# Patient Record
Sex: Male | Born: 1985 | Race: Black or African American | Hispanic: No | Marital: Single | Smoking: Never smoker
Health system: Southern US, Community
[De-identification: ages and names within clinical notes are randomized; demographics above are authoritative.]

## PROBLEM LIST (undated history)

## (undated) HISTORY — PX: FACIAL RECONSTRUCTION SURGERY: SHX631

## (undated) NOTE — ED Notes (Signed)
Formatting of this note might be different from the original.  Pt reports needing sutures removed from left eyelid, placed on 2/11. No other complaints.   Electronically signed by Gaylan Gerold, RN at 09/08/2015  2:38 PM EST
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
## (undated) NOTE — ED Notes (Signed)
Formatting of this note might be different from the original.  Declined W/C at D/C and was escorted to lobby by RN.  Electronically signed by Maryan Puls, RN at 09/08/2015  3:50 PM EST

---
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
## 2013-08-16 ENCOUNTER — Ambulatory Visit
Admission: RE | Admit: 2013-08-16 | Discharge: 2013-08-16 | Disposition: A | Discharge status: Home / Self Care | Payer: Self-pay | Source: Ambulatory Visit | Attending: Internal Medicine | Admitting: Internal Medicine

## 2014-10-08 ENCOUNTER — Emergency Department (HOSPITAL_COMMUNITY)
Admission: EM | Admit: 2014-10-08 | Discharge: 2014-10-08 | Disposition: A | Discharge status: Home / Self Care | Payer: BLUE CROSS/BLUE SHIELD | Attending: Emergency Medicine | Admitting: Emergency Medicine

## 2014-10-08 ENCOUNTER — Encounter (HOSPITAL_COMMUNITY): Payer: Self-pay | Admitting: *Deleted

## 2014-10-08 ENCOUNTER — Emergency Department (HOSPITAL_COMMUNITY): Payer: BLUE CROSS/BLUE SHIELD

## 2014-10-08 DIAGNOSIS — M25571 Pain in right ankle and joints of right foot: Secondary | ICD-10-CM

## 2014-10-08 DIAGNOSIS — Y9367 Activity, basketball: Secondary | ICD-10-CM | POA: Diagnosis not present

## 2014-10-08 DIAGNOSIS — Y9289 Other specified places as the place of occurrence of the external cause: Secondary | ICD-10-CM | POA: Diagnosis not present

## 2014-10-08 DIAGNOSIS — X58XXXA Exposure to other specified factors, initial encounter: Secondary | ICD-10-CM | POA: Insufficient documentation

## 2014-10-08 DIAGNOSIS — Y998 Other external cause status: Secondary | ICD-10-CM | POA: Diagnosis not present

## 2014-10-08 DIAGNOSIS — S99911A Unspecified injury of right ankle, initial encounter: Secondary | ICD-10-CM | POA: Diagnosis present

## 2014-10-08 DIAGNOSIS — Y9389 Activity, other specified: Secondary | ICD-10-CM | POA: Diagnosis not present

## 2014-10-08 MED ORDER — IBUPROFEN 800 MG PO TABS: 800.0000 mg | ORAL_TABLET | Freq: Three times a day (TID) | ORAL | Status: AC | PRN

## 2014-10-08 MED ORDER — IBUPROFEN 400 MG PO TABS
800.0000 mg | ORAL_TABLET | Freq: Once | ORAL | Status: AC
  Administered 2014-10-08: 800 mg via ORAL
  Filled 2014-10-08: qty 2

## 2014-10-08 NOTE — Discharge Instructions (Signed)
Read the information below.  Use the prescribed medication as directed.  Please discuss all new medications with your pharmacist.  You may return to the Emergency Department at any time for worsening condition or any new symptoms that concern you.   If you develop uncontrolled pain, weakness or numbness of the extremity, severe discoloration of the skin, or you are unable to walk or move your toes, return to the ER for a recheck.      Ankle Pain Ankle pain is a common symptom. The bones, cartilage, tendons, and muscles of the ankle joint perform a lot of work each day. The ankle joint holds your body weight and allows you to move around. Ankle pain can occur on either side or back of 1 or both ankles. Ankle pain may be sharp and burning or dull and aching. There may be tenderness, stiffness, redness, or warmth around the ankle. The pain occurs more often when a person walks or puts pressure on the ankle. CAUSES  There are many reasons ankle pain can develop. It is important to work with your caregiver to identify the cause since many conditions can impact the bones, cartilage, muscles, and tendons. Causes for ankle pain include:  Injury, including a break (fracture), sprain, or strain often due to a fall, sports, or a high-impact activity.  Swelling (inflammation) of a tendon (tendonitis).  Achilles tendon rupture.  Ankle instability after repeated sprains and strains.  Poor foot alignment.  Pressure on a nerve (tarsal tunnel syndrome).  Arthritis in the ankle or the lining of the ankle.  Crystal formation in the ankle (gout or pseudogout). DIAGNOSIS  A diagnosis is based on your medical history, your symptoms, results of your physical exam, and results of diagnostic tests. Diagnostic tests may include X-ray exams or a computerized magnetic scan (magnetic resonance imaging, MRI). TREATMENT  Treatment will depend on the cause of your ankle pain and may include:  Keeping pressure off the  ankle and limiting activities.  Using crutches or other walking support (a cane or brace).  Using rest, ice, compression, and elevation.  Participating in physical therapy or home exercises.  Wearing shoe inserts or special shoes.  Losing weight.  Taking medications to reduce pain or swelling or receiving an injection.  Undergoing surgery. HOME CARE INSTRUCTIONS   Only take over-the-counter or prescription medicines for pain, discomfort, or fever as directed by your caregiver.  Put ice on the injured area.  Put ice in a plastic bag.  Place a towel between your skin and the bag.  Leave the ice on for 15-20 minutes at a time, 03-04 times a day.  Keep your leg raised (elevated) when possible to lessen swelling.  Avoid activities that cause ankle pain.  Follow specific exercises as directed by your caregiver.  Record how often you have ankle pain, the location of the pain, and what it feels like. This information may be helpful to you and your caregiver.  Ask your caregiver about returning to work or sports and whether you should drive.  Follow up with your caregiver for further examination, therapy, or testing as directed. SEEK MEDICAL CARE IF:   Pain or swelling continues or worsens beyond 1 week.  You have an oral temperature above 102 F (38.9 C).  You are feeling unwell or have chills.  You are having an increasingly difficult time with walking.  You have loss of sensation or other new symptoms.  You have questions or concerns. MAKE SURE YOU:  Understand these instructions.  Will watch your condition.  Will get help right away if you are not doing well or get worse. Document Released: 12/25/2009 Document Revised: 09/29/2011 Document Reviewed: 12/25/2009 [facility] Patient Information 2015 [place], [place]. This information is not intended to replace advice given to you by your health care provider. Make sure you discuss any questions you have with your  health care provider.

## 2014-10-08 NOTE — ED Notes (Signed)
Pt was playing basketball yesterday and rolled his ankle twice.  Rt foot swollen.

## 2014-10-08 NOTE — ED Provider Notes (Signed)
CSN: [id number]     Arrival date & time 10/08/14  1515 History  This chart was scribed for non-physician practitioner, Trixie DredgeEmily Tessia Kassin, PA-C, working with Samuel JesterKathleen McManus, DO, by Modena JanskyAlbert Thayil, ED Scribe. This patient was seen in room TR05C/TR05C and the patient's care was started at 3:56 PM.   Chief Complaint  Patient presents with  . Foot Pain   The history is provided by the patient. No language interpreter was used.   HPI Comments: Shawn Moss is a 29 y.o. male who presents to the Emergency Department complaining of constant moderate right ankle pain that started yesterday. He reports that he was playing basketball yesterday when he sprained his right ankle twice, and another person fell on him the second time. He denies any LOC or head injury. He states that he has constant moderate right ankle pain with swelling. He describes the pain as a throbbing sensation, and rates it as a 6/10 in severity. He denies any weakness or numbness.  Denies pain in the foot, shin/calf, or knee.   History reviewed. No pertinent past medical history. History reviewed. No pertinent past surgical history. No family history on file. History  Substance Use Topics  . Smoking status: Never Smoker   . Smokeless tobacco: Not on file  . Alcohol Use: No    Review of Systems  Constitutional: Negative for fever and chills.  Musculoskeletal: Positive for myalgias, joint swelling and arthralgias.  Skin: Negative for color change.  Allergic/Immunologic: Negative for immunocompromised state.  Neurological: Negative for syncope and weakness.  Hematological: Does not bruise/bleed easily.  Psychiatric/Behavioral: Negative for self-injury (accidental).    Allergies  Review of patient's allergies indicates no known allergies.  Home Medications   Prior to Admission medications   Not on File   BP 137/81 mmHg  Pulse 66  Temp(Src) 98.2 F (36.8 C) (Oral)  Resp 16  Ht 6\' 2"  (1.88 m)  Wt 160 lb (72.576 kg)  BMI  20.53 kg/m2  SpO2 96% Physical Exam  Constitutional: He appears well-developed and well-nourished. No distress.  HENT:  Head: Normocephalic and atraumatic.  Neck: Neck supple.  Pulmonary/Chest: Effort normal.  Musculoskeletal:  Diffuse edema of the right ankle, no TTP.  DP pulses intact. Reduced ROM of right ankle. Full active ROM of the toes. Sensations intact. Cap refill is less than 2 seconds. No shin, calf, or knee TTP.   Neurological: He is alert.  Skin: He is not diaphoretic.  Nursing note and vitals reviewed.   ED Course  Procedures (including critical care time) DIAGNOSTIC STUDIES: Oxygen Saturation is 96% on RA, normal by my interpretation.    COORDINATION OF CARE: 4:00 PM- Pt advised of plan for treatment which includes radiology and pt agrees.  Labs Review Labs Reviewed - No data to display  Imaging Review Dg Ankle Complete Right  10/08/2014   CLINICAL DATA:  Basketball injury with right ankle pain and swelling today. Initial encounter.  EXAM: RIGHT ANKLE - COMPLETE 3+ VIEW  COMPARISON:  None.  FINDINGS: There is no evidence of acute fracture, subluxation or dislocation.  The ankle mortise is intact.  The talar dome is unremarkable.  Soft tissue swelling is present.  IMPRESSION: Soft tissue swelling without bony abnormality.   Electronically Signed   By: Harmon PierJeffrey  Hu M.D.   On: 10/08/2014 16:35   Dg Foot Complete Right  10/08/2014   CLINICAL DATA:  Basketball injury with right foot pain and swelling. Initial encounter.  EXAM: RIGHT FOOT COMPLETE - 3+ VIEW  COMPARISON:  None.  FINDINGS: There is no evidence fracture, subluxation or dislocation.  The Lisfranc joints are unremarkable.  No focal bony lesions are present.  Soft tissue swelling is present.  IMPRESSION: Soft tissue swelling without bony abnormality.   Electronically Signed   By: Harmon Pier M.D.   On: 10/08/2014 16:36     EKG Interpretation None      MDM   Final diagnoses:  Right ankle pain     Afebrile, nontoxic patient with injury to his right ankle while playing basketball.   Xray negative.  Placed in ASO, given crutches.   D/C home with ibuprofen, PCP follow up PRN.  Discussed result, findings, treatment, and follow up  with patient.  Pt given return precautions.  Pt verbalizes understanding and agrees with plan.       I personally performed the services described in this documentation, which was scribed in my presence. The recorded information has been reviewed and is accurate.    Trixie Dredge, PA-C 10/08/14 1651  Samuel Jester, DO 10/09/14 1330

## 2014-10-08 NOTE — ED Notes (Signed)
Declined W/C at D/C and was escorted to lobby by RN. 

## 2015-09-01 ENCOUNTER — Encounter (HOSPITAL_COMMUNITY): Payer: Self-pay | Admitting: *Deleted

## 2015-09-01 ENCOUNTER — Emergency Department (HOSPITAL_COMMUNITY)
Admission: EM | Admit: 2015-09-01 | Discharge: 2015-09-01 | Disposition: A | Discharge status: Home / Self Care | Payer: BLUE CROSS/BLUE SHIELD | Attending: Emergency Medicine | Admitting: Emergency Medicine

## 2015-09-01 DIAGNOSIS — Y92310 Basketball court as the place of occurrence of the external cause: Secondary | ICD-10-CM | POA: Diagnosis not present

## 2015-09-01 DIAGNOSIS — Y998 Other external cause status: Secondary | ICD-10-CM | POA: Insufficient documentation

## 2015-09-01 DIAGNOSIS — Y9367 Activity, basketball: Secondary | ICD-10-CM | POA: Diagnosis not present

## 2015-09-01 DIAGNOSIS — W51XXXA Accidental striking against or bumped into by another person, initial encounter: Secondary | ICD-10-CM | POA: Diagnosis not present

## 2015-09-01 DIAGNOSIS — S0181XA Laceration without foreign body of other part of head, initial encounter: Secondary | ICD-10-CM

## 2015-09-01 DIAGNOSIS — S01112A Laceration without foreign body of left eyelid and periocular area, initial encounter: Secondary | ICD-10-CM | POA: Insufficient documentation

## 2015-09-01 MED ORDER — LIDOCAINE HCL (PF) 1 % IJ SOLN
5.0000 mL | Freq: Once | INTRAMUSCULAR | Status: AC
  Administered 2015-09-01: 5 mL
  Filled 2015-09-01: qty 5
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
## 2015-09-01 NOTE — ED Notes (Signed)
Pt reports while he was playing basket ball he ran into the head of another player. Pt has a lac to Lt upper eye lid. Pt denies any Loc at time of accident. A/O and speaking in full sentences.

## 2015-09-01 NOTE — ED Notes (Signed)
Declined W/C at D/C and was escorted to lobby by RN. 

## 2015-09-01 NOTE — Discharge Instructions (Signed)
Mr. Shawn Moss,  Nice meeting you! Please follow-up with your primary care provider. You need to have your stiches removed in 3-5 days. You can have these removed anywhere. Return to the emergency department if you develop fevers, chills, yellow/green drainage from the wound, or you develop red streaks around the wound. Feel better soon!  S. Lane Hacker, PA-C   Laceration Care, Adult A laceration is a cut that goes through all layers of the skin. The cut also goes into the tissue that is right under the skin. Some cuts heal on their own. Others need to be closed with stitches (sutures), staples, skin adhesive strips, or wound glue. Taking care of your cut lowers your risk of infection and helps your cut to heal better. HOW TO TAKE CARE OF YOUR CUT For stitches or staples:  Keep the wound clean and dry.  If you were given a bandage (dressing), you should change it at least one time per day or as told by your doctor. You should also change it if it gets wet or dirty.  Keep the wound completely dry for the first 24 hours or as told by your doctor. After that time, you may take a shower or a bath. However, make sure that the wound is not soaked in water until after the stitches or staples have been removed.  Clean the wound one time each day or as told by your doctor:  Wash the wound with soap and water.  Rinse the wound with water until all of the soap comes off.  Pat the wound dry with a clean towel. Do not rub the wound.  After you clean the wound, put a thin layer of antibiotic ointment on it as told by your doctor. This ointment:  Helps to prevent infection.  Keeps the bandage from sticking to the wound.  Have your stitches or staples removed as told by your doctor. If your doctor used skin adhesive strips:   Keep the wound clean and dry.  If you were given a bandage, you should change it at least one time per day or as told by your doctor. You should also change it if it  gets dirty or wet.  Do not get the skin adhesive strips wet. You can take a shower or a bath, but be careful to keep the wound dry.  If the wound gets wet, pat it dry with a clean towel. Do not rub the wound.  Skin adhesive strips fall off on their own. You can trim the strips as the wound heals. Do not remove any strips that are still stuck to the wound. They will fall off after a while. If your doctor used wound glue:  Try to keep your wound dry, but you may briefly wet it in the shower or bath. Do not soak the wound in water, such as by swimming.  After you take a shower or a bath, gently pat the wound dry with a clean towel. Do not rub the wound.  Do not do any activities that will make you really sweaty until the skin glue has fallen off on its own.  Do not apply liquid, cream, or ointment medicine to your wound while the skin glue is still on.  If you were given a bandage, you should change it at least one time per day or as told by your doctor. You should also change it if it gets dirty or wet.  If a bandage is placed over the wound,  do not let the tape for the bandage touch the skin glue.  Do not pick at the glue. The skin glue usually stays on for 5-10 days. Then, it falls off of the skin. General Instructions  To help prevent scarring, make sure to cover your wound with sunscreen whenever you are outside after stitches are removed, after adhesive strips are removed, or when wound glue stays in place and the wound is healed. Make sure to wear a sunscreen of at least 30 SPF.  Take over-the-counter and prescription medicines only as told by your doctor.  If you were given antibiotic medicine or ointment, take or apply it as told by your doctor. Do not stop using the antibiotic even if your wound is getting better.  Do not scratch or pick at the wound.  Keep all follow-up visits as told by your doctor. This is important.  Check your wound every day for signs of infection.  Watch for:  Redness, swelling, or pain.  Fluid, blood, or pus.  Raise (elevate) the injured area above the level of your heart while you are sitting or lying down, if possible. GET HELP IF:  You got a tetanus shot and you have any of these problems at the injection site:  Swelling.  Very bad pain.  Redness.  Bleeding.  You have a fever.  A wound that was closed breaks open.  You notice a bad smell coming from your wound or your bandage.  You notice something coming out of the wound, such as wood or glass.  Medicine does not help your pain.  You have more redness, swelling, or pain at the site of your wound.  You have fluid, blood, or pus coming from your wound.  You notice a change in the color of your skin near your wound.  You need to change the bandage often because fluid, blood, or pus is coming from the wound.  You start to have a new rash.  You start to have numbness around the wound. GET HELP RIGHT AWAY IF:  You have very bad swelling around the wound.  Your pain suddenly gets worse and is very bad.  You notice painful lumps near the wound or on skin that is anywhere on your body.  You have a red streak going away from your wound.  The wound is on your hand or foot and you cannot move a finger or toe like you usually can.  The wound is on your hand or foot and you notice that your fingers or toes look pale or bluish.   This information is not intended to replace advice given to you by your health care provider. Make sure you discuss any questions you have with your health care provider.   Document Released: 12/24/2007 Document Revised: 11/21/2014 Document Reviewed: 07/03/2014 Elsevier Interactive Patient Education 2016 [employer]  Facial Laceration A facial laceration is a cut on the face. These injuries can be painful and cause bleeding. Some cuts may need to be closed with stitches (sutures), skin adhesive strips, or wound glue. Cuts usually  heal quickly but can leave a scar. It can take 1-2 years for the scar to go away completely. HOME CARE   Only take medicines as told by your doctor.  Follow your doctor's instructions for wound care. For Stitches:  Keep the cut clean and dry.  If you have a bandage (dressing), change it at least once a day. Change the bandage if it gets wet or dirty, or  as told by your doctor.  Wash the cut with soap and water 2 times a day. Rinse the cut with water. Pat it dry with a clean towel.  Put a thin layer of medicated cream on the cut as told by your doctor.  You may shower after the first 24 hours. Do not soak the cut in water until the stitches are removed.  Have your stitches removed as told by your doctor.  Do not wear any makeup until a few days after your stitches are removed. For Skin Adhesive Strips:  Keep the cut clean and dry.  Do not get the strips wet. You may take a bath, but be careful to keep the cut dry.  If the cut gets wet, pat it dry with a clean towel.  The strips will fall off on their own. Do not remove the strips that are still stuck to the cut. For Wound Glue:  You may shower or take baths. Do not soak or scrub the cut. Do not swim. Avoid heavy sweating until the glue falls off on its own. After a shower or bath, pat the cut dry with a clean towel.  Do not put medicine or makeup on your cut until the glue falls off.  If you have a bandage, do not put tape over the glue.  Avoid lots of sunlight or tanning lamps until the glue falls off.  The glue will fall off on its own in 5-10 days. Do not pick at the glue. After Healing:  Put sunscreen on the cut for the first year to reduce your scar. GET HELP IF:  You have a fever. GET HELP RIGHT AWAY IF:   Your cut area gets red, painful, or puffy (swollen).  You see a yellowish-white fluid (pus) coming from the cut.   This information is not intended to replace advice given to you by your health care  provider. Make sure you discuss any questions you have with your health care provider.   Document Released: 12/24/2007 Document Revised: 07/28/2014 Document Reviewed: 02/17/2013 Elsevier Interactive Patient Education [employer]

## 2015-09-08 ENCOUNTER — Emergency Department (HOSPITAL_COMMUNITY)
Admission: EM | Admit: 2015-09-08 | Discharge: 2015-09-08 | Disposition: A | Discharge status: Home / Self Care | Payer: BLUE CROSS/BLUE SHIELD | Attending: Emergency Medicine | Admitting: Emergency Medicine

## 2015-09-08 ENCOUNTER — Encounter (HOSPITAL_COMMUNITY): Payer: Self-pay | Admitting: *Deleted

## 2015-09-08 DIAGNOSIS — Z4802 Encounter for removal of sutures: Secondary | ICD-10-CM | POA: Insufficient documentation

## 2015-09-08 NOTE — Discharge Instructions (Signed)

## 2015-09-08 NOTE — ED Notes (Signed)
Pt reports needing sutures removed from left eyelid, placed on 2/11. No other complaints.

## 2015-09-08 NOTE — ED Notes (Signed)
Declined W/C at D/C and was escorted to lobby by RN. 
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
## 2022-11-18 ENCOUNTER — Emergency Department
Admission: EM | Admit: 2022-11-18 | Discharge: 2022-11-18 | Disposition: A | Discharge status: Home / Self Care | Payer: Auto Insurance (includes no fault) | Attending: Student | Admitting: Student

## 2022-11-18 ENCOUNTER — Emergency Department: Payer: Auto Insurance (includes no fault)

## 2022-11-18 DIAGNOSIS — M542 Cervicalgia: Secondary | ICD-10-CM | POA: Insufficient documentation

## 2022-11-18 DIAGNOSIS — Y92410 Unspecified street and highway as the place of occurrence of the external cause: Secondary | ICD-10-CM | POA: Insufficient documentation

## 2022-11-18 MED ORDER — LIDOCAINE 5 % EX PTCH
1.0000 | MEDICATED_PATCH | CUTANEOUS | Status: DC
Start: 2022-11-18 — End: 2022-11-18
  Administered 2022-11-18: 1 via TRANSDERMAL
  Filled 2022-11-18: qty 1

## 2022-11-18 MED ORDER — KETOROLAC TROMETHAMINE 30 MG/ML IJ SOLN
30.0000 mg | Freq: Once | INTRAMUSCULAR | Status: AC
Start: 2022-11-18 — End: 2022-11-18
  Administered 2022-11-18: 30 mg via INTRAMUSCULAR
  Filled 2022-11-18: qty 1

## 2022-11-18 MED ORDER — LIDOCAINE 5 % EX PTCH
1.0000 | MEDICATED_PATCH | CUTANEOUS | 0 refills | Status: AC
Start: 2022-11-18 — End: ?

## 2022-11-18 MED ORDER — CYCLOBENZAPRINE HCL 10 MG PO TABS
10.0000 mg | ORAL_TABLET | Freq: Three times a day (TID) | ORAL | 0 refills | Status: AC | PRN
Start: 2022-11-18 — End: 2022-12-03

## 2022-11-18 MED ORDER — CYCLOBENZAPRINE HCL 10 MG PO TABS
10.0000 mg | ORAL_TABLET | Freq: Once | ORAL | Status: AC
Start: 2022-11-18 — End: 2022-11-18
  Administered 2022-11-18: 10 mg via ORAL
  Filled 2022-11-18: qty 1

## 2022-11-18 MED ORDER — IBUPROFEN 800 MG PO TABS
800.0000 mg | ORAL_TABLET | Freq: Three times a day (TID) | ORAL | 0 refills | Status: DC | PRN
Start: 2022-11-18 — End: 2023-10-20

## 2022-11-18 NOTE — Discharge Instructions (Addendum)
Follow-up with the clinic and the specialist that we have provided if your pain.  Please take the medications as prescribed please do not drink or drive while taking the muscle relaxant.  If you develop any new numbness, tingling, or any other concerning symptoms please return to the emergency department.
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
## 2023-10-20 ENCOUNTER — Encounter (INDEPENDENT_AMBULATORY_CARE_PROVIDER_SITE_OTHER): Payer: Self-pay | Admitting: Family

## 2023-10-20 ENCOUNTER — Ambulatory Visit (INDEPENDENT_AMBULATORY_CARE_PROVIDER_SITE_OTHER): Admitting: Family

## 2023-10-20 VITALS — BP 143/87 | HR 69 | Temp 97.3°F | Resp 19 | Ht 75.0 in | Wt 181.6 lb

## 2023-10-20 DIAGNOSIS — M62830 Muscle spasm of back: Secondary | ICD-10-CM

## 2023-10-20 MED ORDER — CYCLOBENZAPRINE HCL 10 MG PO TABS
10.0000 mg | ORAL_TABLET | Freq: Every evening | ORAL | 0 refills | Status: AC
Start: 2023-10-20 — End: ?

## 2023-10-20 MED ORDER — IBUPROFEN 600 MG PO TABS
600.0000 mg | ORAL_TABLET | Freq: Four times a day (QID) | ORAL | 0 refills | Status: AC | PRN
Start: 2023-10-20 — End: ?

## 2023-10-20 NOTE — Progress Notes (Signed)
 Patient: Raymond Garrison   Date: 10/20/2023   MRN: [id number]           Subjective        Chief Complaint   Patient presents with    Back Pain     Pt here with L upper back pain x Wednesday night while bending down in the shower. Says it hurts with certain movements and sometimes the pain radiates to his chest- it went away and then he felt it again on Sunday. Took pain meds otc this morning.           Back Pain      Raymond Garrison is a 38 y.o. male who presents today with complaints of left upper back pain that radiates to the chest wall pain that began 1 week(s) ago.   Mechanism of injury: pushing, pulling, and twisting  Exacerbating factors: bending   Alleviating factors:ibuprofen   Denies: Associated symptoms: Numbness, Weakness  Radiation: admits to left side of the chest  Prior injury to this area: No,but patient does resistance physical activity and push and pull, lft at work place    History:  Pertinent Past Medical, Surgical, Family and Social History were reviewed.  Current Medications[1]  Allergies[2]  Medications and Allergies reviewed.         Objective   Vitals:    10/20/23 1634   BP: 143/87   BP Site: Left arm   Patient Position: Sitting   Cuff Size: Medium   Pulse: 69   Resp: 19   Temp: 97.3 F (36.3 C)   TempSrc: Tympanic   SpO2: 99%   Weight: 82.4 kg (181 lb 9.6 oz)   Height: 1.905 m (6' 3)     Body mass index is 22.7 kg/m.    Physical Exam  Vitals and nursing note reviewed.   Constitutional:       General: He is not in acute distress.     Appearance: Normal appearance. He is not ill-appearing, toxic-appearing or diaphoretic.   HENT:      Head: Normocephalic and atraumatic.      Nose: Nose normal.   Cardiovascular:      Rate and Rhythm: Normal rate and regular rhythm.      Pulses: Normal pulses.   Pulmonary:      Effort: Pulmonary effort is normal.   Musculoskeletal:      Comments: Muscular pain that is reproducible on palpation and passive range of motion   Neurological:      Mental Status: He is  alert.              UCC Course   There were no labs reviewed with this patient during the visit.  There were no x-rays reviewed with this patient during the visit.  Current Inpatient Medications with Last Dose Taken[3]       Procedures   Procedures      MDM/Assessment    Back muscle spasm pain    Differential Diagnosis including but not limited to: sprain, strain, fracture, dislocation, contusion, hematoma  Encounter Diagnosis   Name Primary?    Spasm of thoracic back muscle Yes  Plan  Provided treatment with NSAID and muscle relaxant medications  Advised to use heating pad at the affected area      No orders of the defined types were placed in this encounter.    Requested Prescriptions     Signed Prescriptions Disp Refills    ibuprofen  (ADVIL ) 600 MG tablet 20 tablet 0     Sig: Take 1 tablet (600 mg) by mouth every 6 (six) hours as needed for Pain    cyclobenzaprine  (FLEXERIL ) 10 MG tablet 15 tablet 0     Sig: Take 1 tablet (10 mg) by mouth nightly       Discussed results and diagnosis with patient/family.  Reviewed warning signs for worsening condition, as well as, indications for follow-up with primary care physician and return to urgent care clinic.   Patient/family expressed understanding of instructions.     An After Visit Summary with pertinent information was made available to patient/family via MyChart or in-print.       [1]   Current Outpatient Medications:     cyclobenzaprine  (FLEXERIL ) 10 MG tablet, Take 1 tablet (10 mg) by mouth nightly, Disp: 15 tablet, Rfl: 0    ibuprofen  (ADVIL ) 600 MG tablet, Take 1 tablet (600 mg) by mouth every 6 (six) hours as needed for Pain, Disp: 20 tablet, Rfl: 0    lidocaine  (LIDODERM ) 5 %, Place 1 patch onto the skin every 24 hours Remove & Discard patch within 12 hours or as directed by MD (Patient not taking:  Reported on 10/20/2023), Disp: 15 patch, Rfl: 0  [2] No Known Allergies  [3]   No current facility-administered medications for this visit.
